# Patient Record
Sex: Male | Born: 1980 | Race: White | Hispanic: No | Marital: Married | Smoking: Never smoker
Health system: Southern US, Community
[De-identification: ages and names within clinical notes are randomized; demographics above are authoritative.]

## PROBLEM LIST (undated history)

## (undated) DIAGNOSIS — L851 Acquired keratosis [keratoderma] palmaris et plantaris: Secondary | ICD-10-CM

## (undated) DIAGNOSIS — E785 Hyperlipidemia, unspecified: Secondary | ICD-10-CM

## (undated) DIAGNOSIS — J13 Pneumonia due to Streptococcus pneumoniae: Secondary | ICD-10-CM

## (undated) HISTORY — DX: Hyperlipidemia, unspecified: E78.5

## (undated) HISTORY — DX: Pneumonia due to Streptococcus pneumoniae: J13

## (undated) HISTORY — DX: Acquired keratosis (keratoderma) palmaris et plantaris: L85.1

---

## 2007-07-10 ENCOUNTER — Ambulatory Visit: Payer: Self-pay | Admitting: Internal Medicine

## 2007-09-03 ENCOUNTER — Ambulatory Visit: Payer: Self-pay | Admitting: Internal Medicine

## 2007-09-13 ENCOUNTER — Ambulatory Visit: Payer: Self-pay | Admitting: Internal Medicine

## 2007-09-14 LAB — CONVERTED CEMR LAB
Direct LDL: 197.6 mg/dL
Total CHOL/HDL Ratio: 8.5

## 2008-06-13 ENCOUNTER — Ambulatory Visit: Payer: Self-pay | Admitting: Internal Medicine

## 2008-06-13 DIAGNOSIS — E785 Hyperlipidemia, unspecified: Secondary | ICD-10-CM

## 2008-06-13 DIAGNOSIS — J13 Pneumonia due to Streptococcus pneumoniae: Secondary | ICD-10-CM

## 2008-06-13 HISTORY — DX: Hyperlipidemia, unspecified: E78.5

## 2008-06-13 HISTORY — DX: Pneumonia due to Streptococcus pneumoniae: J13

## 2008-11-04 ENCOUNTER — Ambulatory Visit: Payer: Self-pay | Admitting: Internal Medicine

## 2008-11-06 LAB — CONVERTED CEMR LAB
AST: 22 units/L (ref 0–37)
Cholesterol: 195 mg/dL (ref 0–200)

## 2009-07-01 ENCOUNTER — Telehealth: Payer: Self-pay | Admitting: Internal Medicine

## 2009-07-15 ENCOUNTER — Ambulatory Visit: Payer: Self-pay | Admitting: Internal Medicine

## 2009-10-06 ENCOUNTER — Ambulatory Visit: Payer: Self-pay | Admitting: Internal Medicine

## 2009-10-06 LAB — CONVERTED CEMR LAB
BUN: 14 mg/dL (ref 6–23)
Basophils Absolute: 0 10*3/uL (ref 0.0–0.1)
Basophils Relative: 0.4 % (ref 0.0–3.0)
Bilirubin, Direct: 0.2 mg/dL (ref 0.0–0.3)
CO2: 30 meq/L (ref 19–32)
Calcium: 9.4 mg/dL (ref 8.4–10.5)
Chloride: 108 meq/L (ref 96–112)
Cholesterol: 206 mg/dL — ABNORMAL HIGH (ref 0–200)
Creatinine, Ser: 0.9 mg/dL (ref 0.4–1.5)
Direct LDL: 161.1 mg/dL
Eosinophils Absolute: 0 10*3/uL (ref 0.0–0.7)
Glucose, Bld: 90 mg/dL (ref 70–99)
MCHC: 35.4 g/dL (ref 30.0–36.0)
MCV: 89.2 fL (ref 78.0–100.0)
Monocytes Absolute: 0.3 10*3/uL (ref 0.1–1.0)
Neutrophils Relative %: 60.5 % (ref 43.0–77.0)
Platelets: 207 10*3/uL (ref 150.0–400.0)
RBC: 4.73 M/uL (ref 4.22–5.81)
RDW: 12.3 % (ref 11.5–14.6)
Total Protein: 7.1 g/dL (ref 6.0–8.3)
Triglycerides: 88 mg/dL (ref 0.0–149.0)

## 2009-10-30 ENCOUNTER — Ambulatory Visit: Payer: Self-pay | Admitting: Internal Medicine

## 2010-02-17 ENCOUNTER — Ambulatory Visit: Payer: Self-pay | Admitting: Internal Medicine

## 2010-02-17 DIAGNOSIS — L851 Acquired keratosis [keratoderma] palmaris et plantaris: Secondary | ICD-10-CM

## 2010-02-17 HISTORY — DX: Acquired keratosis (keratoderma) palmaris et plantaris: L85.1

## 2010-02-17 LAB — CONVERTED CEMR LAB
AST: 20 units/L (ref 0–37)
Cholesterol: 173 mg/dL (ref 0–200)

## 2010-04-27 NOTE — Assessment & Plan Note (Signed)
Summary: dry skin in ears and around eyes/cjrlipids,SGOT//ccm/pt rsc/cjr   Vital Signs:  Patient profile:   30 year old male Weight:      188 pounds Temp:     97.6 degrees F oral BP sitting:   102 / 64  (right arm) Cuff size:   regular  Vitals Entered By: Duard Brady LPN (February 17, 2010 10:04 AM) CC: c/o dry skin behind ears    otc creams/onit not helping Is Patient Diabetic? No   CC:  c/o dry skin behind ears    otc creams/onit not helping.  History of Present Illness: 30 year old patient who has a history of hypercholesterolemia, controlled on Lipitor therapy.  He has a several week history is dry, flaky skin in the postauricular area bilaterally.  He has been using Neosporin for a few weeks and also moisturizing creams.  He does use head and shoulders shampoo daily.  He remains on Lipitor 80 mg daily, which he continues to tolerate well.  Denies any muscle pain or weakness  Allergies (verified): No Known Drug Allergies  Past History:  Past Medical History: Reviewed history from 06/13/2008 and no changes required. no hospital admissions Hyperlipidemia  Review of Systems       The patient complains of suspicious skin lesions.  The patient denies anorexia, fever, weight loss, weight gain, vision loss, decreased hearing, hoarseness, chest pain, syncope, dyspnea on exertion, peripheral edema, prolonged cough, headaches, hemoptysis, abdominal pain, melena, hematochezia, severe indigestion/heartburn, hematuria, incontinence, genital sores, muscle weakness, transient blindness, difficulty walking, depression, unusual weight change, abnormal bleeding, enlarged lymph nodes, angioedema, breast masses, and testicular masses.    Physical Exam  General:  Well-developed,well-nourished,in no acute distress; alert,appropriate and cooperative throughout examination Head:  Normocephalic and atraumatic without obvious abnormalities. No apparent alopecia or balding. Eyes:  No corneal  or conjunctival inflammation noted. EOMI. Perrla. Funduscopic exam benign, without hemorrhages, exudates or papilledema. Vision grossly normal. Ears:  slightly red flaky skin in the posterior to the area; canals and tympanic membranes were normal Mouth:  Oral mucosa and oropharynx without lesions or exudates.  Teeth in good repair.   Impression & Recommendations:  Problem # 1:  HYPERLIPIDEMIA (ICD-272.4)  His updated medication list for this problem includes:    Lipitor 80 Mg Tabs (Atorvastatin calcium) ..... One daily  Orders: Venipuncture (29528) TLB-Cholesterol, Total (82465-CHO) TLB-AST (SGOT) (84450-SGOT)  Problem # 2:  DRY SKIN (ICD-701.1)  Complete Medication List: 1)  Lipitor 80 Mg Tabs (Atorvastatin calcium) .... One daily 2)  Triamcinolone Acetonide 0.1 % Crea (Triamcinolone acetonide) .... Use  twice daily  Patient Instructions: 1)  Please schedule a follow-up appointment as needed. 2)  It is important that you exercise regularly at least 20 minutes 5 times a week. If you develop chest pain, have severe difficulty breathing, or feel very tired , stop exercising immediately and seek medical attention. Prescriptions: TRIAMCINOLONE ACETONIDE 0.1 % CREA (TRIAMCINOLONE ACETONIDE) use  twice daily  #30 gm x 1   Entered and Authorized by:   [name]  MD   Signed by:   [name]  MD on 02/17/2010   Method used:   Electronically to        [facility] (retail)       [address]       [place], [place]  [address]       Ph: [phone]       Fax: [phone]  RxID:   [id number]    Orders Added: 1)  Est. Patient Level III [32440] 2)  Venipuncture [10272] 3)  TLB-Cholesterol, Total [82465-CHO] 4)  TLB-AST (SGOT) [84450-SGOT]  Appended Document: Orders Update    Clinical Lists Changes  Orders: Added new Service order of Specimen Handling (53664) - Signed      Appended Document: Orders  Update    Clinical Lists Changes  Orders: Added new Test order of T- * Misc. Laboratory test [phone]) - Signed

## 2010-04-27 NOTE — Assessment & Plan Note (Signed)
Summary: throat inf/njr   Vital Signs:  Patient profile:   30 year old male Weight:      182 pounds Temp:     98.0 degrees F oral BP sitting:   120 / 80  (right arm) Cuff size:   regular  Vitals Entered By: Duard Brady LPN (October 06, 2009 11:44 AM) CC: c/o coughing up "hard round ballsof phlem" , no pain Is Patient Diabetic? No   CC:  c/o coughing up "hard round ballsof phlem"  and no pain.  History of Present Illness: 30 year old patient who is seen today complaining of a mild sore throat.  His chief complaint is expectorating small concretions which are foul-smelling and causing halitosis.  Denies any fever, sinus pain.  He does admit to mild postnasal drip  Allergies (verified): No Known Drug Allergies  Past History:  Past Medical History: Reviewed history from 06/13/2008 and no changes required. no hospital admissions Hyperlipidemia  Physical Exam  General:  Well-developed,well-nourished,in no acute distress; alert,appropriate and cooperative throughout examination Head:  Normocephalic and atraumatic without obvious abnormalities. No apparent alopecia or balding. Eyes:  No corneal or conjunctival inflammation noted. EOMI. Perrla. Funduscopic exam benign, without hemorrhages, exudates or papilledema. Vision grossly normal. Ears:  External ear exam shows no significant lesions or deformities.  Otoscopic examination reveals clear canals, tympanic membranes are intact bilaterally without bulging, retraction, inflammation or discharge. Hearing is grossly normal bilaterally. Nose:  External nasal examination shows no deformity or inflammation. Nasal mucosa are pink and moist without lesions or exudates. Mouth:  pharyngeal erythema.   Neck:  No deformities, masses, or tenderness noted. Lungs:  Normal respiratory effort, chest expands symmetrically. Lungs are clear to auscultation, no crackles or wheezes.   Impression & Recommendations:  Problem # 1:  PHARYNGITIS-ACUTE  (ICD-462)  Complete Medication List: 1)  Simvastatin 40 Mg Tabs (Simvastatin) .Marland Kitchen.. 1 once daily  Other Orders: Venipuncture (16109) TLB-Lipid Panel (80061-LIPID) TLB-BMP (Basic Metabolic Panel-BMET) (80048-METABOL) TLB-CBC Platelet - w/Differential (85025-CBCD) TLB-Hepatic/Liver Function Pnl (80076-HEPATIC) TLB-TSH (Thyroid Stimulating Hormone) (60454-UJW)  Patient Instructions: 1)  Get plenty of rest, drink lots of clear liquids, and use Tylenol or Ibuprofen for fever and comfort. Return in 7-10 days if you're not better:sooner if you're feeling worse.

## 2010-04-27 NOTE — Assessment & Plan Note (Signed)
Summary: 1 year follow up/patient coming in fasting/cjr rsc bmp/njr   Vital Signs:  Patient profile:   30 year old male Height:      72 inches Weight:      187 pounds BMI:     25.45 Temp:     97.6 degrees F oral BP sitting:   108 / 70  (right arm) Cuff size:   regular  Vitals Entered By: Duard Brady LPN (October 30, 2009 8:09 AM) CC: yrly check up Is Patient Diabetic? No   CC:  yrly check up.  History of Present Illness: a 30 year old patient who has a history of dyslipidemia.  He is on simvastatin 40 mg daily.  He has been compliant with his medications.  He has had no side effects.  Recent laboratory studies were reviewed, and LDL cholesterol was 161.  Allergies (verified): No Known Drug Allergies  Past History:  Past Medical History: Reviewed history from 06/13/2008 and no changes required. no hospital admissions Hyperlipidemia  Past Surgical History: Reviewed history from 07/10/2007 and no changes required. no prior surgery  Review of Systems  The patient denies anorexia, fever, weight loss, weight gain, vision loss, decreased hearing, hoarseness, chest pain, syncope, dyspnea on exertion, peripheral edema, prolonged cough, headaches, hemoptysis, abdominal pain, melena, hematochezia, severe indigestion/heartburn, hematuria, incontinence, genital sores, muscle weakness, suspicious skin lesions, transient blindness, difficulty walking, depression, unusual weight change, abnormal bleeding, enlarged lymph nodes, angioedema, breast masses, and testicular masses.    Physical Exam  General:  mildly overweight.  Blood pressure low normal Head:  Normocephalic and atraumatic without obvious abnormalities. No apparent alopecia or balding. Eyes:  No corneal or conjunctival inflammation noted. EOMI. Perrla. Funduscopic exam benign, without hemorrhages, exudates or papilledema. Vision grossly normal. Mouth:  Oral mucosa and oropharynx without lesions or exudates.  Teeth in  good repair. Neck:  No deformities, masses, or tenderness noted. Lungs:  Normal respiratory effort, chest expands symmetrically. Lungs are clear to auscultation, no crackles or wheezes. Heart:  Normal rate and regular rhythm. S1 and S2 normal without gallop, murmur, click, rub or other extra sounds. Abdomen:  Bowel sounds positive,abdomen soft and non-tender without masses, organomegaly or hernias noted. Genitalia:  Testes bilaterally descended without nodularity, tenderness or masses. No scrotal masses or lesions. No penis lesions or urethral discharge. Msk:  No deformity or scoliosis noted of thoracic or lumbar spine.   Pulses:  R and L carotid,radial,femoral,dorsalis pedis and posterior tibial pulses are full and equal bilaterally Extremities:  No clubbing, cyanosis, edema, or deformity noted with normal full range of motion of all joints.   Neurologic:  alert & oriented X3 and gait normal.  alert & oriented X3 and gait normal.   Cervical Nodes:  No lymphadenopathy noted Psych:  Cognition and judgment appear intact. Alert and cooperative with normal attention span and concentration. No apparent delusions, illusions, hallucinations   Impression & Recommendations:  Problem # 1:  HYPERLIPIDEMIA (ICD-272.4)  The following medications were removed from the medication list:    Simvastatin 40 Mg Tabs (Simvastatin) .Marland Kitchen... 1 once daily His updated medication list for this problem includes:    Lipitor 80 Mg Tabs (Atorvastatin calcium) ..... One daily  The following medications were removed from the medication list:    Simvastatin 40 Mg Tabs (Simvastatin) .Marland Kitchen... 1 once daily His updated medication list for this problem includes:    Lipitor 80 Mg Tabs (Atorvastatin calcium) ..... One daily  Complete Medication List: 1)  Lipitor 80 Mg Tabs (Atorvastatin calcium) .Marland KitchenMarland KitchenMarland Kitchen  One daily  Patient Instructions: 1)  lipid profile, and SGOT in 3 months 2)  Please schedule a follow-up appointment in 1  year. Prescriptions: LIPITOR 80 MG TABS (ATORVASTATIN CALCIUM) one daily  #90 x 6   Entered and Authorized by:   [name]  MD   Signed by:   [name]  MD on 10/30/2009   Method used:   Electronically to        [facility] [address] (retail)       [address]       [place], [place]  [address]       Ph: [phone]       Fax: [phone]   RxID:   [id number]

## 2010-04-27 NOTE — Progress Notes (Signed)
Summary: lab work  Phone Note Call from Patient Call back at [employer] [phone]   Caller: Patient Call For: [name]  MD Summary of Call: pt is on simvastin he had not had bloodwork since 11-04-2008. pt is req labs Initial call taken by: Heron Sabins,  July 01, 2009 4:19 PM  Follow-up for Phone Call        ROV this week with lab Follow-up by: [name]  MD,  July 06, 2009 7:02 AM  Additional Follow-up for Phone Call Additional follow up Details #1::        lmom Additional Follow-up by: Heron Sabins,  July 06, 2009 9:18 AM    Additional Follow-up for Phone Call Additional follow up Details #2::    ov 07-15-2009 915am Follow-up by: Heron Sabins,  July 07, 2009 1:03 PM

## 2010-04-27 NOTE — Assessment & Plan Note (Signed)
Summary: pt will come in fasting/njr   Vital Signs:  Patient profile:   30 year old male Weight:      182 pounds Temp:     97.6 degrees F oral BP sitting:   122 / 70  (right arm) Cuff size:   regular  Vitals Entered By: Duard Brady LPN (July 15, 2009 9:20 AM) CC: medication refill - fasting labs Is Patient Diabetic? No   CC:  medication refill - fasting labs.  History of Present Illness: 30 year old patient who has a history of dyslipidemia, and who has been treated with simvastatin since 2009.  His been on this medication for a couple of weeks.  He is tolerant medication well in the past.  He is seen today basically for a medicine refill.  His past medical issues.  Otherwise, unremarkable.  He denies any muscle pain or weakness.  Preventive Screening-Counseling & Management  Alcohol-Tobacco     Smoking Status: quit  Allergies (verified): No Known Drug Allergies  Past History:  Past Medical History: Reviewed history from 06/13/2008 and no changes required. no hospital admissions Hyperlipidemia  Social History: Smoking Status:  quit  Review of Systems  The patient denies anorexia, fever, weight loss, weight gain, vision loss, decreased hearing, hoarseness, chest pain, syncope, dyspnea on exertion, peripheral edema, prolonged cough, headaches, hemoptysis, abdominal pain, melena, hematochezia, severe indigestion/heartburn, hematuria, incontinence, genital sores, muscle weakness, suspicious skin lesions, transient blindness, difficulty walking, depression, unusual weight change, abnormal bleeding, enlarged lymph nodes, angioedema, breast masses, and testicular masses.    Physical Exam  General:  Well-developed,well-nourished,in no acute distress; alert,appropriate and cooperative throughout examination Head:  Normocephalic and atraumatic without obvious abnormalities. No apparent alopecia or balding. Eyes:  No corneal or conjunctival inflammation noted. EOMI.  Perrla. Funduscopic exam benign, without hemorrhages, exudates or papilledema. Vision grossly normal. Ears:  External ear exam shows no significant lesions or deformities.  Otoscopic examination reveals clear canals, tympanic membranes are intact bilaterally without bulging, retraction, inflammation or discharge. Hearing is grossly normal bilaterally. Mouth:  Oral mucosa and oropharynx without lesions or exudates.  Teeth in good repair. Neck:  No deformities, masses, or tenderness noted. Lungs:  Normal respiratory effort, chest expands symmetrically. Lungs are clear to auscultation, no crackles or wheezes. Heart:  Normal rate and regular rhythm. S1 and S2 normal without gallop, murmur, click, rub or other extra sounds. Abdomen:  Bowel sounds positive,abdomen soft and non-tender without masses, organomegaly or hernias noted. Msk:  No deformity or scoliosis noted of thoracic or lumbar spine.   Pulses:  R and L carotid,radial,femoral,dorsalis pedis and posterior tibial pulses are full and equal bilaterally Extremities:  No clubbing, cyanosis, edema, or deformity noted with normal full range of motion of all joints.     Impression & Recommendations:  Problem # 1:  HYPERLIPIDEMIA (ICD-272.4)  His updated medication list for this problem includes:    Simvastatin 40 Mg Tabs (Simvastatin) .Marland Kitchen... 1 once daily  His updated medication list for this problem includes:    Simvastatin 40 Mg Tabs (Simvastatin) .Marland Kitchen... 1 once daily  Complete Medication List: 1)  Simvastatin 40 Mg Tabs (Simvastatin) .Marland Kitchen.. 1 once daily  Patient Instructions: 1)  Please schedule a follow-up appointment in 1 year. 2)  It is important that you exercise regularly at least 20 minutes 5 times a week. If you develop chest pain, have severe difficulty breathing, or feel very tired , stop exercising immediately and seek medical attention. Prescriptions: SIMVASTATIN 40 MG  TABS (SIMVASTATIN) 1 once  daily  #90 x 6   Entered and  Authorized by:   [name]  MD   Signed by:   [name]  MD on 07/15/2009   Method used:   Print then Give to Patient   RxID:   [id number] SIMVASTATIN 40 MG  TABS (SIMVASTATIN) 1 once daily  #90 x 6   Entered and Authorized by:   [name]  MD   Signed by:   [name]  MD on 07/15/2009   Method used:   Electronically to        [facility] # [id number] (retail)       [address]       [place], [place]  [address]       Ph: [phone]       Fax: [phone]   RxID:   [id number]

## 2010-08-10 ENCOUNTER — Telehealth: Payer: Self-pay | Admitting: *Deleted

## 2010-08-10 NOTE — Telephone Encounter (Signed)
Pt believes he was attacked by a bat last night, and he has broken skin areas in his head.  What should he do?

## 2010-08-10 NOTE — Telephone Encounter (Signed)
Per Dr. Kirtland Bouchard, have pt call the Public Health Dept.   Pt was informed.

## 2010-08-11 ENCOUNTER — Ambulatory Visit (INDEPENDENT_AMBULATORY_CARE_PROVIDER_SITE_OTHER): Payer: BC Managed Care – PPO | Admitting: Family Medicine

## 2010-08-11 ENCOUNTER — Emergency Department (HOSPITAL_COMMUNITY)
Admission: EM | Admit: 2010-08-11 | Discharge: 2010-08-11 | Disposition: A | Discharge status: Home / Self Care | Payer: BC Managed Care – PPO | Attending: Emergency Medicine | Admitting: Emergency Medicine

## 2010-08-11 ENCOUNTER — Encounter: Payer: Self-pay | Admitting: Family Medicine

## 2010-08-11 VITALS — BP 130/84 | Temp 97.9°F | Ht 71.25 in | Wt 186.0 lb

## 2010-08-11 DIAGNOSIS — IMO0002 Reserved for concepts with insufficient information to code with codable children: Secondary | ICD-10-CM | POA: Insufficient documentation

## 2010-08-11 DIAGNOSIS — Z203 Contact with and (suspected) exposure to rabies: Secondary | ICD-10-CM | POA: Insufficient documentation

## 2010-08-11 DIAGNOSIS — W64XXXA Exposure to other animate mechanical forces, initial encounter: Secondary | ICD-10-CM | POA: Insufficient documentation

## 2010-08-11 DIAGNOSIS — T1490XA Injury, unspecified, initial encounter: Secondary | ICD-10-CM

## 2010-08-11 DIAGNOSIS — T148XXA Other injury of unspecified body region, initial encounter: Secondary | ICD-10-CM

## 2010-08-11 NOTE — Progress Notes (Signed)
  Subjective:    Patient ID: Hayden Murphy, male    DOB: [birth year], 30 y.o.   MRN: [id number]  HPI Patient is seen with possible exposure to bat. He was walking outside Saturday night which was 4 days ago now and in the middle of the road when he felt a scratching sensation mid parietal occipital area. Did not actually see a bat but assumed bat might have bitten him or scratched him. He is not aware of any other likely mechanism of injury. Was not walking underneath a tree use or branches. He has not had any fever, chills, headaches or other symptoms.  Patient apparently called the department of public health and was told to call here.   Review of Systems  Constitutional: Negative for fever and chills.  HENT: Negative for neck pain and neck stiffness.   Musculoskeletal: Negative for arthralgias.  Neurological: Negative for dizziness, seizures, syncope, weakness and headaches.  Hematological: Negative for adenopathy.  Psychiatric/Behavioral: Negative for confusion.       Objective:   Physical Exam  Constitutional: He is oriented to person, place, and time. He appears well-developed and well-nourished. No distress.  HENT:  Head: Normocephalic.       Patient has 3 separate small punctate eschars mid parietal occipital region. No evidence for cellulitis  Cardiovascular: Normal rate, regular rhythm and normal heart sounds.   No murmur heard. Pulmonary/Chest: Effort normal and breath sounds normal. No respiratory distress. He has no wheezes. He has no rales.  Neurological: He is alert and oriented to person, place, and time. No cranial nerve deficit.          Assessment & Plan:  Patient presents with possible bite from a bat. There is no other valid explanation for his injury. He was in wide open space and had seen bats flying in the area though he cannot confirm that he was bitten by a bat. There was definite break in the skin. Given the potentially dire consequences of any potential  rabies exposure patient needs rabies immunoglobulin and vaccine. Discussed with Dr. Roxan Hockey [facility] health Department who concurs. Patient sent to [facility] emergency room for vaccine and immunoglobulin.

## 2010-08-11 NOTE — Patient Instructions (Signed)
Go to the ER at [facility] for rabies immunoglobulin and vaccine.

## 2010-08-14 ENCOUNTER — Inpatient Hospital Stay (INDEPENDENT_AMBULATORY_CARE_PROVIDER_SITE_OTHER)
Admission: RE | Admit: 2010-08-14 | Discharge: 2010-08-14 | Disposition: A | Discharge status: Home / Self Care | Payer: BC Managed Care – PPO | Source: Ambulatory Visit | Attending: Family Medicine | Admitting: Family Medicine

## 2010-08-14 DIAGNOSIS — Z23 Encounter for immunization: Secondary | ICD-10-CM

## 2010-08-19 ENCOUNTER — Inpatient Hospital Stay (INDEPENDENT_AMBULATORY_CARE_PROVIDER_SITE_OTHER)
Admission: RE | Admit: 2010-08-19 | Discharge: 2010-08-19 | Disposition: A | Discharge status: Home / Self Care | Payer: BC Managed Care – PPO | Source: Ambulatory Visit | Attending: Emergency Medicine | Admitting: Emergency Medicine

## 2010-08-19 DIAGNOSIS — Z23 Encounter for immunization: Secondary | ICD-10-CM

## 2010-08-26 ENCOUNTER — Inpatient Hospital Stay (INDEPENDENT_AMBULATORY_CARE_PROVIDER_SITE_OTHER)
Admission: RE | Admit: 2010-08-26 | Discharge: 2010-08-26 | Disposition: A | Discharge status: Home / Self Care | Payer: BC Managed Care – PPO | Source: Ambulatory Visit

## 2010-08-26 DIAGNOSIS — Z23 Encounter for immunization: Secondary | ICD-10-CM

## 2010-11-19 ENCOUNTER — Other Ambulatory Visit: Payer: Self-pay | Admitting: Internal Medicine

## 2010-12-31 ENCOUNTER — Ambulatory Visit (INDEPENDENT_AMBULATORY_CARE_PROVIDER_SITE_OTHER): Payer: BC Managed Care – PPO | Admitting: Internal Medicine

## 2010-12-31 ENCOUNTER — Encounter: Payer: Self-pay | Admitting: Internal Medicine

## 2010-12-31 ENCOUNTER — Telehealth: Payer: Self-pay | Admitting: Internal Medicine

## 2010-12-31 VITALS — BP 130/74 | Temp 98.0°F | Wt 190.0 lb

## 2010-12-31 DIAGNOSIS — H6090 Unspecified otitis externa, unspecified ear: Secondary | ICD-10-CM

## 2010-12-31 DIAGNOSIS — H60399 Other infective otitis externa, unspecified ear: Secondary | ICD-10-CM

## 2010-12-31 MED ORDER — NEOMYCIN-POLYMYXIN-HC 3.5-10000-1 OT SOLN: 3.0000 [drp] | Freq: Three times a day (TID) | OTIC | Status: AC

## 2010-12-31 MED ORDER — TRIAMCINOLONE ACETONIDE 0.025 % EX CREA: TOPICAL_CREAM | Freq: Two times a day (BID) | CUTANEOUS | Status: DC

## 2010-12-31 MED ORDER — DIPHENHYD-HC-NYSTATIN-TETRACYC MT SUSP: OROMUCOSAL | Status: DC

## 2010-12-31 NOTE — Patient Instructions (Signed)
Call or return to clinic prn if these symptoms worsen or fail to improve as anticipated.

## 2010-12-31 NOTE — Progress Notes (Signed)
  Subjective:    Patient ID: Hayden Murphy, male    DOB: [birth year], 30 y.o.   MRN: [id number]  HPI  30 year old patient who is seen today for followup. He has 2 concerns for the past few days she has had some drainage from the right ear is noted some minimal discomfort. For the past 2 weeks he has had some mild discomfort and burning quality to the tip of his tongue. Denies any fever. He takes no chronic medications other than Lipitor    Review of Systems  Constitutional: Negative for fever, chills, appetite change and fatigue.  HENT: Positive for ear discharge. Negative for hearing loss, ear pain, congestion, sore throat, trouble swallowing, neck stiffness, dental problem, voice change and tinnitus.   Eyes: Negative for pain, discharge and visual disturbance.  Respiratory: Negative for cough, chest tightness, wheezing and stridor.   Cardiovascular: Negative for chest pain, palpitations and leg swelling.  Gastrointestinal: Negative for nausea, vomiting, abdominal pain, diarrhea, constipation, blood in stool and abdominal distention.  Genitourinary: Negative for urgency, hematuria, flank pain, discharge, difficulty urinating and genital sores.  Musculoskeletal: Negative for myalgias, back pain, joint swelling, arthralgias and gait problem.  Skin: Negative for rash.  Neurological: Negative for dizziness, syncope, speech difficulty, weakness, numbness and headaches.  Hematological: Negative for adenopathy. Does not bruise/bleed easily.  Psychiatric/Behavioral: Negative for behavioral problems and dysphoric mood. The patient is not nervous/anxious.        Objective:   Physical Exam  Constitutional: He appears well-developed and well-nourished.  HENT:  Left Ear: External ear normal.  Nose: Nose normal.  Mouth/Throat: No oropharyngeal exudate.       Right canal was minimally erythematous with some dry exudate in the canal. The right tympanic membrane was slightly erythematous. No. A  periaericular adenopathy  The tip of the tongue was slightly erythematous and atrophic with lack of normal papilla  Neck: Normal range of motion. Neck supple.  Lymphadenopathy:    He has no cervical adenopathy.          Assessment & Plan:

## 2010-12-31 NOTE — Telephone Encounter (Signed)
Spoke with pharmacy - gave new order

## 2010-12-31 NOTE — Telephone Encounter (Signed)
Please advise 

## 2010-12-31 NOTE — Telephone Encounter (Signed)
Change to generic [facility]  magic mouthwash 6 ounces 1 teaspoon swish and swallow 4 times daily

## 2010-12-31 NOTE — Telephone Encounter (Signed)
Pharmacist needs the qty of compound for Diphenhyd-HC-Nystatin-Tetracyc SUSP and the formula for this.

## 2011-02-25 ENCOUNTER — Ambulatory Visit (INDEPENDENT_AMBULATORY_CARE_PROVIDER_SITE_OTHER): Payer: BC Managed Care – PPO | Admitting: Internal Medicine

## 2011-02-25 ENCOUNTER — Encounter: Payer: Self-pay | Admitting: Internal Medicine

## 2011-02-25 VITALS — BP 110/70 | Temp 98.2°F | Wt 188.0 lb

## 2011-02-25 DIAGNOSIS — J069 Acute upper respiratory infection, unspecified: Secondary | ICD-10-CM

## 2011-02-25 MED ORDER — HYDROCODONE-HOMATROPINE 5-1.5 MG/5ML PO SYRP: 5.0000 mL | ORAL_SOLUTION | Freq: Four times a day (QID) | ORAL | Status: AC | PRN

## 2011-02-25 NOTE — Patient Instructions (Signed)
Get plenty of rest, Drink lots of  clear liquids, and use Tylenol or ibuprofen for fever and discomfort.    Call or return to clinic prn if these symptoms worsen or fail to improve as anticipated.  

## 2011-02-25 NOTE — Progress Notes (Signed)
  Subjective:    Patient ID: Natanael Saladin, male    DOB: 12-14-1980, 30 y.o.   MRN: [id number]  HPI  30 year old patient who presents with a three-week history of head and chest congestion. He has had no recent fever he has sinus congestion and minimal cough productive of yellow sputum he has mild sore throat and hoarseness. He does have a history of community acquired pneumonia 2 years ago.    Review of Systems  Constitutional: Positive for fatigue. Negative for fever and chills.  HENT: Positive for congestion, sore throat, rhinorrhea and voice change.   Respiratory: Positive for cough.        Objective:   Physical Exam  Constitutional: He appears well-developed and well-nourished.  HENT:  Head: Normocephalic and atraumatic.  Mouth/Throat: No oropharyngeal exudate.       Mild erythema of the oropharynx  Neck: Normal range of motion. Neck supple. No thyromegaly present.  Cardiovascular: Regular rhythm, normal heart sounds and intact distal pulses.   Pulmonary/Chest: Effort normal and breath sounds normal. No respiratory distress. He has no wheezes.       O2 saturation 98%  Lymphadenopathy:    He has no cervical adenopathy.          Assessment & Plan:   Viral URI. Will continue symptomatic treatment

## 2011-08-26 ENCOUNTER — Other Ambulatory Visit: Payer: Self-pay | Admitting: Internal Medicine

## 2011-09-27 ENCOUNTER — Ambulatory Visit (INDEPENDENT_AMBULATORY_CARE_PROVIDER_SITE_OTHER): Payer: BC Managed Care – PPO | Admitting: Internal Medicine

## 2011-09-27 ENCOUNTER — Encounter: Payer: Self-pay | Admitting: Internal Medicine

## 2011-09-27 ENCOUNTER — Telehealth: Payer: Self-pay | Admitting: Internal Medicine

## 2011-09-27 VITALS — BP 110/70 | Temp 98.4°F | Wt 189.0 lb

## 2011-09-27 DIAGNOSIS — H6090 Unspecified otitis externa, unspecified ear: Secondary | ICD-10-CM

## 2011-09-27 DIAGNOSIS — H60399 Other infective otitis externa, unspecified ear: Secondary | ICD-10-CM

## 2011-09-27 DIAGNOSIS — L851 Acquired keratosis [keratoderma] palmaris et plantaris: Secondary | ICD-10-CM

## 2011-09-27 MED ORDER — DIPHENHYD-HC-NYSTATIN-TETRACYC MT SUSP: OROMUCOSAL | Status: DC

## 2011-09-27 MED ORDER — TRIAMCINOLONE ACETONIDE 0.1 % EX CREA: TOPICAL_CREAM | Freq: Two times a day (BID) | CUTANEOUS | Status: DC

## 2011-09-27 MED ORDER — SALICYLIC ACID 2 % EX SHAM: MEDICATED_SHAMPOO | Freq: Every day | CUTANEOUS | Status: AC | PRN

## 2011-09-27 MED ORDER — ATORVASTATIN CALCIUM 80 MG PO TABS: 80.0000 mg | ORAL_TABLET | Freq: Every day | ORAL | Status: DC

## 2011-09-27 NOTE — Telephone Encounter (Signed)
Magic mouthwash 6 oz one tsp swish and swallow QID

## 2011-09-27 NOTE — Progress Notes (Signed)
  Subjective:    Patient ID: Hayden Murphy, male    DOB: Apr 29, 1980, 31 y.o.   MRN: [id number]  HPI 31 year old patient who is seen today for dermatologic concerns. He was seen approximately 7 months ago and treated for a right otitis externa. He continues to have intermittent drainage from the right ear. He also has noted a dry scaly patch involving his left frontal scalp area.   Review of Systems  HENT: Positive for ear discharge.   Skin: Positive for rash.       Objective:   Physical Exam  Constitutional: He appears well-developed and well-nourished. No distress.  HENT:       Left tympanic membrane and canal normal The right tympanic membrane was normal. There is some mild erythematous changes in the canal but nothing very striking No adenopathy  Skin:       A 2-1/2 cm patchy area of dried thickened skin noted in the left frontal scalp area          Assessment & Plan:   Otitis externa Scalp dermatitis  Will refill otic drops that were quite helpful in the past; will try a coal  tar shampoo and consider dermatologic referral if unimproved

## 2011-09-27 NOTE — Patient Instructions (Signed)
Call or return to clinic prn if these symptoms worsen or fail to improve as anticipated.

## 2011-09-27 NOTE — Telephone Encounter (Signed)
[facility] [employer] and states has received script for Diphenhydramine Mouthwash that contains Tetracycline. Has not been available for "15 years" and would like a new script sent to [phone]-phone number as pt. Is at this pharmacy to pick up med. States Magic Mouthwash is what is usually used in place of prescribed mouthwash.

## 2011-09-28 ENCOUNTER — Telehealth: Payer: Self-pay | Admitting: Internal Medicine

## 2011-09-28 MED ORDER — NEOMYCIN-POLYMYXIN-HC 3.5-10000-1 OT SOLN: 2.0000 [drp] | Freq: Four times a day (QID) | OTIC | Status: DC

## 2011-09-28 MED ORDER — MAGIC MOUTHWASH W/LIDOCAINE: 5.0000 mL | Freq: Four times a day (QID) | ORAL | Status: DC

## 2011-09-28 NOTE — Telephone Encounter (Signed)
done

## 2011-09-28 NOTE — Telephone Encounter (Signed)
rx sen tp pt aware

## 2011-09-28 NOTE — Telephone Encounter (Signed)
Generic cortisporin otic  10 cc 2 gtts  QID

## 2011-09-28 NOTE — Telephone Encounter (Signed)
Patient called stating that he did not have an rx called in for his ear. Should this be OTC?

## 2011-10-21 ENCOUNTER — Encounter: Payer: Self-pay | Admitting: Family Medicine

## 2011-10-21 ENCOUNTER — Ambulatory Visit (INDEPENDENT_AMBULATORY_CARE_PROVIDER_SITE_OTHER)
Admission: RE | Admit: 2011-10-21 | Discharge: 2011-10-21 | Disposition: A | Discharge status: Home / Self Care | Payer: BC Managed Care – PPO | Source: Ambulatory Visit | Attending: Family Medicine | Admitting: Family Medicine

## 2011-10-21 ENCOUNTER — Ambulatory Visit (INDEPENDENT_AMBULATORY_CARE_PROVIDER_SITE_OTHER): Payer: BC Managed Care – PPO | Admitting: Family Medicine

## 2011-10-21 VITALS — BP 102/68 | HR 78 | Temp 98.3°F | Wt 189.0 lb

## 2011-10-21 DIAGNOSIS — S93409A Sprain of unspecified ligament of unspecified ankle, initial encounter: Secondary | ICD-10-CM

## 2011-10-21 NOTE — Progress Notes (Signed)
  Subjective:    Patient ID: Hayden Murphy, male    DOB: [birth year], 31 y.o.   MRN: [id number]  HPI Here for an injury to the right ankle yesterday while running. He stepped on a curb and twisted the ankle. He has had pain and swelling since, more so on the medial side. Using ice and Advil.    Review of Systems  Constitutional: Negative.   Musculoskeletal: Positive for joint swelling and arthralgias.       Objective:   Physical Exam  Constitutional:       In pain, limping  Musculoskeletal:       The right ankle is mildly swollen, not ecchymotic. Quite tender just inferior to both medial and lateral malleoli. Not tender over the tibia or fibula. Good ROM.           Assessment & Plan:  Probable high ankle sprain. Fitted with an Aircast splint. He will go from here to [facility] to get Xrays. Use Advil and ice.

## 2011-10-21 NOTE — Progress Notes (Signed)
Quick Note:  I spoke with pt ______ 

## 2012-03-13 ENCOUNTER — Other Ambulatory Visit: Payer: Self-pay | Admitting: Internal Medicine

## 2012-07-10 ENCOUNTER — Other Ambulatory Visit: Payer: Self-pay | Admitting: Internal Medicine

## 2012-09-07 ENCOUNTER — Ambulatory Visit (INDEPENDENT_AMBULATORY_CARE_PROVIDER_SITE_OTHER): Payer: BC Managed Care – PPO | Admitting: Internal Medicine

## 2012-09-07 ENCOUNTER — Encounter: Payer: Self-pay | Admitting: Internal Medicine

## 2012-09-07 VITALS — BP 110/70 | HR 76 | Temp 97.8°F | Resp 18 | Wt 193.0 lb

## 2012-09-07 DIAGNOSIS — E785 Hyperlipidemia, unspecified: Secondary | ICD-10-CM

## 2012-09-07 MED ORDER — ATORVASTATIN CALCIUM 80 MG PO TABS: ORAL_TABLET | ORAL | Status: DC

## 2012-09-07 NOTE — Patient Instructions (Signed)
It is important that you exercise regularly, at least 20 minutes 3 to 4 times per week.  If you develop chest pain or shortness of breath seek  medical attention.  Call or return to clinic as needed   

## 2012-09-07 NOTE — Progress Notes (Signed)
Subjective:    Patient ID: Hayden Murphy, male    DOB: 1980-12-25, 32 y.o.   MRN: [id number]  HPI 32 year old patient who has a history of dyslipidemia. Remains on atorvastatin 80 mg. Done quite well without concerns or complaints today. Nonsmoker.  Past Medical History  Diagnosis Date  . HYPERLIPIDEMIA 06/13/2008  . PNEUMONIA, COMMUNITY ACQUIRED, PNEUMOCOCCAL 06/13/2008  . DRY SKIN 02/17/2010    History   Social History  . Marital Status: Married    Spouse Name: N/A    Number of Children: N/A  . Years of Education: N/A   Occupational History  . Not on file.   Social History Main Topics  . Smoking status: Never Smoker   . Smokeless tobacco: Never Used  . Alcohol Use: 1.0 oz/week    2 drink(s) per week  . Drug Use: No  . Sexually Active: Not on file   Other Topics Concern  . Not on file   Social History Narrative  . No narrative on file    History reviewed. No pertinent past surgical history.  Family History  Problem Relation Age of Onset  . Hyperlipidemia Father   . Hyperlipidemia Brother     both brothers    No Known Allergies  Current Outpatient Prescriptions on File Prior to Visit  Medication Sig Dispense Refill  . atorvastatin (LIPITOR) 80 MG tablet TAKE ONE TABLET BY MOUTH ONE TIME DAILY  90 tablet  0  . coal tar-salicylic acid 2 % shampoo Apply topically daily as needed for itching.  120 mL  12   No current facility-administered medications on file prior to visit.    BP 110/70  Pulse 76  Temp(Src) 97.8 F (36.6 C) (Oral)  Resp 18  Wt 193 lb (87.544 kg)  BMI 26.72 kg/m2  SpO2 98%       Review of Systems  Constitutional: Negative for fever, chills, appetite change and fatigue.  HENT: Negative for hearing loss, ear pain, congestion, sore throat, trouble swallowing, neck stiffness, dental problem, voice change and tinnitus.   Eyes: Negative for pain, discharge and visual disturbance.  Respiratory: Negative for cough, chest tightness,  wheezing and stridor.   Cardiovascular: Negative for chest pain, palpitations and leg swelling.  Gastrointestinal: Negative for nausea, vomiting, abdominal pain, diarrhea, constipation, blood in stool and abdominal distention.  Genitourinary: Negative for urgency, hematuria, flank pain, discharge, difficulty urinating and genital sores.  Musculoskeletal: Negative for myalgias, back pain, joint swelling, arthralgias and gait problem.  Skin: Negative for rash.  Neurological: Negative for dizziness, syncope, speech difficulty, weakness, numbness and headaches.  Hematological: Negative for adenopathy. Does not bruise/bleed easily.  Psychiatric/Behavioral: Negative for behavioral problems and dysphoric mood. The patient is not nervous/anxious.        Objective:   Physical Exam  Constitutional: He is oriented to person, place, and time. He appears well-developed.  HENT:  Head: Normocephalic.  Right Ear: External ear normal.  Left Ear: External ear normal.  Eyes: Conjunctivae and EOM are normal.  Neck: Normal range of motion.  Cardiovascular: Normal rate and normal heart sounds.   Pulmonary/Chest: Breath sounds normal.  Abdominal: Bowel sounds are normal.  Musculoskeletal: Normal range of motion. He exhibits no edema and no tenderness.  Neurological: He is alert and oriented to person, place, and time.  Psychiatric: He has a normal mood and affect. His behavior is normal.          Assessment & Plan:   Dyslipidemia. We'll continue atorvastatin 80. Recheck 1 year or  as needed

## 2013-06-25 ENCOUNTER — Encounter: Payer: Self-pay | Admitting: Family Medicine

## 2013-06-25 ENCOUNTER — Ambulatory Visit (INDEPENDENT_AMBULATORY_CARE_PROVIDER_SITE_OTHER): Payer: BC Managed Care – PPO | Admitting: Family Medicine

## 2013-06-25 VITALS — BP 128/72 | HR 81 | Wt 194.0 lb

## 2013-06-25 DIAGNOSIS — M545 Low back pain, unspecified: Secondary | ICD-10-CM

## 2013-06-25 MED ORDER — PREDNISONE 10 MG PO TABS: ORAL_TABLET | ORAL | Status: DC

## 2013-06-25 MED ORDER — METHOCARBAMOL 500 MG PO TABS: 500.0000 mg | ORAL_TABLET | Freq: Three times a day (TID) | ORAL | Status: DC | PRN

## 2013-06-25 NOTE — Progress Notes (Signed)
   Subjective:    Patient ID: Hayden Murphy, male    DOB: 07/26/1980, 33 y.o.   MRN: [id number]  Back Pain Pertinent negatives include no abdominal pain, chest pain, dysuria, fever, numbness or weakness.   Acute visit for lumbar back pain. Onset 5-6 days ago. No specific injury. He noticed this after some prolonged sitting. Location is left lower lumbar and somewhat generalized. No radiculopathy symptoms. No numbness or weakness. No appetite changes, fever, chills, or any dysuria.  Pain is worse with sitting and somewhat relieved with lying and walking. Pain also worse with back flexion. He's taken Aleve and Advil without much improvement. No prior history of back difficulties  Past Medical History  Diagnosis Date  . HYPERLIPIDEMIA 06/13/2008  . PNEUMONIA, COMMUNITY ACQUIRED, PNEUMOCOCCAL 06/13/2008  . DRY SKIN 02/17/2010   No past surgical history on file.  reports that he has never smoked. He has never used smokeless tobacco. He reports that he drinks about 1.0 ounces of alcohol per week. He reports that he does not use illicit drugs. family history includes Hyperlipidemia in his brother and father. No Known Allergies    Review of Systems  Constitutional: Negative for fever, chills, activity change, appetite change and unexpected weight change.  Respiratory: Negative for cough and shortness of breath.   Cardiovascular: Negative for chest pain and leg swelling.  Gastrointestinal: Negative for vomiting and abdominal pain.  Genitourinary: Negative for dysuria, hematuria and flank pain.  Musculoskeletal: Positive for back pain. Negative for joint swelling.  Neurological: Negative for weakness and numbness.       Objective:   Physical Exam  Constitutional: He appears well-developed and well-nourished.  Cardiovascular: Normal rate and regular rhythm.   Pulmonary/Chest: Effort normal and breath sounds normal. No respiratory distress. He has no wheezes. He has no rales.    Musculoskeletal: He exhibits no edema.  Straight leg raise is negative on the left. He has pain with back flexion of about 60. No pain with back extension.  Neurological:  Full-strength lower extremities. Symmetric reflexes.          Assessment & Plan:  Acute lumbar back pain. Nonfocal exam neurologically. Robaxin 500 mg every 8 hours as needed for muscle spasm. Caution about sedation. Prednisone taper over 8 days. Consider physical therapy if not improving over the next couple of weeks

## 2013-06-25 NOTE — Progress Notes (Signed)
Pre visit review using our clinic review tool, if applicable. No additional management support is needed unless otherwise documented below in the visit note. 

## 2013-06-25 NOTE — Patient Instructions (Signed)
Lumbosacral Strain Lumbosacral strain is a strain of any of the parts that make up your lumbosacral vertebrae. Your lumbosacral vertebrae are the bones that make up the lower third of your backbone. Your lumbosacral vertebrae are held together by muscles and tough, fibrous tissue (ligaments).  CAUSES  A sudden blow to your back can cause lumbosacral strain. Also, anything that causes an excessive stretch of the muscles in the low back can cause this strain. This is typically seen when people exert themselves strenuously, fall, lift heavy objects, bend, or crouch repeatedly. RISK FACTORS  Physically demanding work.  Participation in pushing or pulling sports or sports that require sudden twist of the back (tennis, golf, baseball).  Weight lifting.  Excessive lower back curvature.  Forward-tilted pelvis.  Weak back or abdominal muscles or both.  Tight hamstrings. SIGNS AND SYMPTOMS  Lumbosacral strain may cause pain in the area of your injury or pain that moves (radiates) down your leg.  DIAGNOSIS Your health care provider can often diagnose lumbosacral strain through a physical exam. In some cases, you may need tests such as X-ray exams.  TREATMENT  Treatment for your lower back injury depends on many factors that your clinician will have to evaluate. However, most treatment will include the use of anti-inflammatory medicines. HOME CARE INSTRUCTIONS   Avoid hard physical activities (tennis, racquetball, waterskiing) if you are not in proper physical condition for it. This may aggravate or create problems.  If you have a back problem, avoid sports requiring sudden body movements. Swimming and walking are generally safer activities.  Maintain good posture.  Maintain a healthy weight.  For acute conditions, you may put ice on the injured area.  Put ice in a plastic bag.  Place a towel between your skin and the bag.  Leave the ice on for 20 minutes, 2 3 times a day.  When the  low back starts healing, stretching and strengthening exercises may be recommended. SEEK MEDICAL CARE IF:  Your back pain is getting worse.  You experience severe back pain not relieved with medicines. SEEK IMMEDIATE MEDICAL CARE IF:   You have numbness, tingling, weakness, or problems with the use of your arms or legs.  There is a change in bowel or bladder control.  You have increasing pain in any area of the body, including your belly (abdomen).  You notice shortness of breath, dizziness, or feel faint.  You feel sick to your stomach (nauseous), are throwing up (vomiting), or become sweaty.  You notice discoloration of your toes or legs, or your feet get very cold. MAKE SURE YOU:   Understand these instructions.  Will watch your condition.  Will get help right away if you are not doing well or get worse. Document Released: 12/22/2004 Document Revised: 01/02/2013 Document Reviewed: 10/31/2012 Exit[employer] Patient Information 2014 [employer].  

## 2013-07-02 ENCOUNTER — Emergency Department (HOSPITAL_COMMUNITY): Payer: BC Managed Care – PPO

## 2013-07-02 ENCOUNTER — Encounter (HOSPITAL_COMMUNITY): Payer: Self-pay | Admitting: Emergency Medicine

## 2013-07-02 ENCOUNTER — Emergency Department (HOSPITAL_COMMUNITY)
Admission: EM | Admit: 2013-07-02 | Discharge: 2013-07-02 | Disposition: A | Discharge status: Home / Self Care | Payer: BC Managed Care – PPO | Attending: Emergency Medicine | Admitting: Emergency Medicine

## 2013-07-02 DIAGNOSIS — Z8701 Personal history of pneumonia (recurrent): Secondary | ICD-10-CM | POA: Insufficient documentation

## 2013-07-02 DIAGNOSIS — E785 Hyperlipidemia, unspecified: Secondary | ICD-10-CM | POA: Insufficient documentation

## 2013-07-02 DIAGNOSIS — M545 Low back pain, unspecified: Secondary | ICD-10-CM | POA: Insufficient documentation

## 2013-07-02 DIAGNOSIS — Z872 Personal history of diseases of the skin and subcutaneous tissue: Secondary | ICD-10-CM | POA: Insufficient documentation

## 2013-07-02 DIAGNOSIS — Z79899 Other long term (current) drug therapy: Secondary | ICD-10-CM | POA: Insufficient documentation

## 2013-07-02 MED ORDER — IBUPROFEN 800 MG PO TABS: 800.0000 mg | ORAL_TABLET | Freq: Three times a day (TID) | ORAL | Status: AC

## 2013-07-02 MED ORDER — OXYCODONE-ACETAMINOPHEN 5-325 MG PO TABS: 2.0000 | ORAL_TABLET | ORAL | Status: AC | PRN

## 2013-07-02 MED ORDER — HYDROMORPHONE HCL PF 1 MG/ML IJ SOLN
1.0000 mg | Freq: Once | INTRAMUSCULAR | Status: AC
  Administered 2013-07-02: 1 mg via INTRAMUSCULAR
  Filled 2013-07-02: qty 1

## 2013-07-02 MED ORDER — IBUPROFEN 800 MG PO TABS
800.0000 mg | ORAL_TABLET | Freq: Once | ORAL | Status: AC
  Administered 2013-07-02: 800 mg via ORAL
  Filled 2013-07-02: qty 1

## 2013-07-02 NOTE — ED Notes (Signed)
Pt states he was taking muscle relaxers and antiinflamitories given to him by PCP for past 1 1/2 weeks but he bent down today and heard two pops in his hips and now has pain in lower back that wraps around to his lower groin. Alert and oriented.

## 2013-07-02 NOTE — ED Provider Notes (Signed)
CSN: [id number]     Arrival date & time 07/02/13  0202 History   First MD Initiated Contact with Patient 07/02/13 [phone]     Chief Complaint  Patient presents with  . Back Pain     (Consider location/radiation/quality/duration/timing/severity/associated sxs/prior Treatment) HPI History provided by patient. Low back pain ongoing for the last 2 weeks. Saw his primary care physician and was prescribed muscle relaxer and steroids. He has been steroids and still taking muscle relaxers as needed. Tonight he was walking with his son and bend over or feeling a pop in his back. He was able walk home and now pain has become more severe, radiating from left lower back to left hip. It also across the right side of his back. No radiation to lower extremity. No lower extremity weakness or numbness. No incontinence of bowel or bladder. He has never had imaging of his back. Pain is sharp in quality and moderate to severe, worse with any type of movement.  Past Medical History  Diagnosis Date  . HYPERLIPIDEMIA 06/13/2008  . PNEUMONIA, COMMUNITY ACQUIRED, PNEUMOCOCCAL 06/13/2008  . DRY SKIN 02/17/2010   History reviewed. No pertinent past surgical history. Family History  Problem Relation Age of Onset  . Hyperlipidemia Father   . Hyperlipidemia Brother     both brothers   History  Substance Use Topics  . Smoking status: Never Smoker   . Smokeless tobacco: Never Used  . Alcohol Use: 1.0 oz/week    2 drink(s) per week    Review of Systems  Constitutional: Negative for fever and chills.  Respiratory: Negative for shortness of breath.   Cardiovascular: Negative for chest pain.  Gastrointestinal: Negative for abdominal pain.  Genitourinary: Negative for dysuria and difficulty urinating.  Musculoskeletal: Positive for back pain.  Skin: Negative for rash.  Neurological: Negative for weakness, numbness and headaches.  All other systems reviewed and are negative.      Allergies  Review of patient's  allergies indicates no known allergies.  Home Medications   Current Outpatient Rx  Name  Route  Sig  Dispense  Refill  . atorvastatin (LIPITOR) 80 MG tablet      TAKE ONE TABLET BY MOUTH ONE TIME DAILY   90 tablet   6   . methocarbamol (ROBAXIN) 500 MG tablet   Oral   Take 1 tablet (500 mg total) by mouth 3 (three) times daily as needed for muscle spasms.   20 tablet   0   . predniSONE (DELTASONE) 10 MG tablet   Oral   Take 10 mg by mouth daily with breakfast. 7 day therapy course patient completed on 09/30/2013. Taper as follows:4-4-4-3-3-2-2          BP 127/89  Pulse 82  Temp(Src) 98.4 F (36.9 C) (Oral)  Resp 16  SpO2 96% Physical Exam  Constitutional: He is oriented to person, place, and time. He appears well-developed and well-nourished.  HENT:  Head: Normocephalic and atraumatic.  Eyes: EOM are normal. Pupils are equal, round, and reactive to light.  Neck: Neck supple.  Cardiovascular: Regular rhythm and intact distal pulses.   Pulmonary/Chest: Effort normal and breath sounds normal. No respiratory distress.  Abdominal: Soft. He exhibits no distension. There is no tenderness.  Musculoskeletal: Normal range of motion. He exhibits no edema.  Tender over mid lumbar and paralumbar spine. Pelvis stable. No lower extremity deficits with equal patellar DTRs, equal dorsi plantar flexion and sensorium to light touch grossly intact  Neurological: He is alert and oriented  to person, place, and time. No cranial nerve deficit. Coordination normal.  Skin: Skin is warm and dry.    ED Course  Procedures (including critical care time) Labs Review Labs Reviewed - No data to display Imaging Review Dg Lumbar Spine Complete  07/02/2013   CLINICAL DATA:  Lower back and groin pain.  EXAM: LUMBAR SPINE - COMPLETE 4+ VIEW  COMPARISON:  None.  FINDINGS: There is no evidence of fracture or subluxation. Vertebral bodies demonstrate normal height and alignment. Intervertebral disc spaces  are preserved. The visualized neural foramina are grossly unremarkable in appearance.  The visualized bowel gas pattern is unremarkable in appearance; air and stool are noted within the colon. The sacroiliac joints are within normal limits.  IMPRESSION: No evidence of fracture or subluxation along the lumbar spine.   Electronically Signed   By: Roanna RaiderJeffery  Chang M.D.   On: 07/02/2013 05:04    Dilaudid IM. Ice. Motrin.  6:22 AM pain improved, ambulating in the emergency department with still some back pain but feeling significantly better.  Plan discharge home with back pain precautions. Will provide prescription for Motrin 800 mg and Percocet to take as needed. Patient agrees to close primary care followup. Back pain precautions provided. MDM   Diagnosis: Low back pain  Evaluated with imaging reviewed as above. Pain improved with medications including narcotics Vital signs and nursing notes reviewed and considered.    Sunnie NielsenBrian Gage Treiber, MD 07/02/13 [phone]

## 2013-07-02 NOTE — Discharge Instructions (Signed)
Back Pain, Adult Low back pain is very common. About 1 in 5 people have back pain.The cause of low back pain is rarely dangerous. The pain often gets better over time.About half of people with a sudden onset of back pain feel better in just 2 weeks. About 8 in 10 people feel better by 6 weeks.  CAUSES Some common causes of back pain include:  Strain of the muscles or ligaments supporting the spine.  Wear and tear (degeneration) of the spinal discs.  Arthritis.  Direct injury to the back. DIAGNOSIS Most of the time, the direct cause of low back pain is not known.However, back pain can be treated effectively even when the exact cause of the pain is unknown.Answering your caregiver's questions about your overall health and symptoms is one of the most accurate ways to make sure the cause of your pain is not dangerous. If your caregiver needs more information, he or she may order lab work or imaging tests (X-rays or MRIs).However, even if imaging tests show changes in your back, this usually does not require surgery. HOME CARE INSTRUCTIONS For many people, back pain returns.Since low back pain is rarely dangerous, it is often a condition that people can learn to manageon their own.   Remain active. It is stressful on the back to sit or stand in one place. Do not sit, drive, or stand in one place for more than 30 minutes at a time. Take short walks on level surfaces as soon as pain allows.Try to increase the length of time you walk each day.  Do not stay in bed.Resting more than 1 or 2 days can delay your recovery.  Do not avoid exercise or work.Your body is made to move.It is not dangerous to be active, even though your back may hurt.Your back will likely heal faster if you return to being active before your pain is gone.  Pay attention to your body when you bend and lift. Many people have less discomfortwhen lifting if they bend their knees, keep the load close to their bodies,and  avoid twisting. Often, the most comfortable positions are those that put less stress on your recovering back.  Find a comfortable position to sleep. Use a firm mattress and lie on your side with your knees slightly bent. If you lie on your back, put a pillow under your knees.  Only take over-the-counter or prescription medicines as directed by your caregiver. Over-the-counter medicines to reduce pain and inflammation are often the most helpful.Your caregiver may prescribe muscle relaxant drugs.These medicines help dull your pain so you can more quickly return to your normal activities and healthy exercise.  Put ice on the injured area.  Put ice in a plastic bag.  Place a towel between your skin and the bag.  Leave the ice on for 15-20 minutes, 03-04 times a day for the first 2 to 3 days. After that, ice and heat may be alternated to reduce pain and spasms.  Ask your caregiver about trying back exercises and gentle massage. This may be of some benefit.  Avoid feeling anxious or stressed.Stress increases muscle tension and can worsen back pain.It is important to recognize when you are anxious or stressed and learn ways to manage it.Exercise is a great option. SEEK MEDICAL CARE IF:  You have pain that is not relieved with rest or medicine.  You have pain that does not improve in 1 week.  You have new symptoms.  You are generally not feeling well. SEEK   IMMEDIATE MEDICAL CARE IF:   You have pain that radiates from your back into your legs.  You develop new bowel or bladder control problems.  You have unusual weakness or numbness in your arms or legs.  You develop nausea or vomiting.  You develop abdominal pain.  You feel faint. Document Released: 03/14/2005 Document Revised: 09/13/2011 Document Reviewed: 08/02/2010 [employer] Patient Information 2014 [employer].  

## 2013-07-08 ENCOUNTER — Ambulatory Visit (INDEPENDENT_AMBULATORY_CARE_PROVIDER_SITE_OTHER): Payer: BC Managed Care – PPO | Admitting: Internal Medicine

## 2013-07-08 ENCOUNTER — Encounter: Payer: Self-pay | Admitting: Internal Medicine

## 2013-07-08 ENCOUNTER — Ambulatory Visit: Payer: BC Managed Care – PPO | Admitting: Internal Medicine

## 2013-07-08 VITALS — BP 130/96 | HR 81 | Temp 97.7°F | Resp 20 | Ht 71.25 in | Wt 195.0 lb

## 2013-07-08 DIAGNOSIS — M545 Low back pain, unspecified: Secondary | ICD-10-CM

## 2013-07-08 NOTE — Progress Notes (Signed)
Subjective:    Patient ID: Hayden Murphy, male    DOB: 02/01/1981, 33 y.o.   MRN: [id number]  HPI  33 year old patient who is seen today with recurrence.  Low back pain.  This has been present for approximately 3 weeks.  It has prompted a recent ED evaluation, where radiographs were negative.  Pain is in the lumbar area without radiation.  No motor weakness.  No prior history of chronic low back pain.  Past Medical History  Diagnosis Date  . HYPERLIPIDEMIA 06/13/2008  . PNEUMONIA, COMMUNITY ACQUIRED, PNEUMOCOCCAL 06/13/2008  . DRY SKIN 02/17/2010    History   Social History  . Marital Status: Married    Spouse Name: N/A    Number of Children: N/A  . Years of Education: N/A   Occupational History  . Not on file.   Social History Main Topics  . Smoking status: Never Smoker   . Smokeless tobacco: Never Used  . Alcohol Use: 1.0 oz/week    2 drink(s) per week  . Drug Use: No  . Sexual Activity: Not on file   Other Topics Concern  . Not on file   Social History Narrative  . No narrative on file    History reviewed. No pertinent past surgical history.  Family History  Problem Relation Age of Onset  . Hyperlipidemia Father   . Hyperlipidemia Brother     both brothers    No Known Allergies  Current Outpatient Prescriptions on File Prior to Visit  Medication Sig Dispense Refill  . atorvastatin (LIPITOR) 80 MG tablet TAKE ONE TABLET BY MOUTH ONE TIME DAILY  90 tablet  6  . ibuprofen (ADVIL,MOTRIN) 800 MG tablet Take 1 tablet (800 mg total) by mouth 3 (three) times daily.  21 tablet  0  . oxyCODONE-acetaminophen (PERCOCET/ROXICET) 5-325 MG per tablet Take 2 tablets by mouth every 4 (four) hours as needed for severe pain.  15 tablet  0   No current facility-administered medications on file prior to visit.    BP 130/96  Pulse 81  Temp(Src) 97.7 F (36.5 C) (Oral)  Resp 20  Ht 5' 11.25" (1.81 m)  Wt 195 lb (88.451 kg)  BMI 27.00 kg/m2  SpO2 98%       Review  of Systems  Constitutional: Negative for fever, chills, appetite change and fatigue.  HENT: Negative for congestion, dental problem, ear pain, hearing loss, sore throat, tinnitus, trouble swallowing and voice change.   Eyes: Negative for pain, discharge and visual disturbance.  Respiratory: Negative for cough, chest tightness, wheezing and stridor.   Cardiovascular: Negative for chest pain, palpitations and leg swelling.  Gastrointestinal: Negative for nausea, vomiting, abdominal pain, diarrhea, constipation, blood in stool and abdominal distention.  Genitourinary: Negative for urgency, hematuria, flank pain, discharge, difficulty urinating and genital sores.  Musculoskeletal: Positive for back pain. Negative for arthralgias, gait problem, joint swelling, myalgias and neck stiffness.  Skin: Negative for rash.  Neurological: Negative for dizziness, syncope, speech difficulty, weakness, numbness and headaches.  Hematological: Negative for adenopathy. Does not bruise/bleed easily.  Psychiatric/Behavioral: Negative for behavioral problems and dysphoric mood. The patient is not nervous/anxious.        Objective:   Physical Exam  Constitutional: He appears well-developed and well-nourished. No distress.  Blood pressure 130/90 on repeat  BP Readings from Last 3 Encounters: 07/08/13 : 130/96 07/02/13 : 127/82 06/25/13 : 128/72  Musculoskeletal:  Negative straight leg test Hip flexion on the left did tend to aggravate the discomfort Reflexes  brisk and equal Normal.  Motor exam          Assessment & Plan:   Low back pain.  Suspect musculoligamentous.  We'll continue Aleve twice a day.  Warm compresses and gentle stretching.  No indication for more specific imaging studies at this time.  Will call if not improved

## 2013-07-08 NOTE — Progress Notes (Signed)
Pre-visit discussion using our clinic review tool. No additional management support is needed unless otherwise documented below in the visit note.  

## 2013-07-08 NOTE — Patient Instructions (Signed)
Most patients with low back pain will improve with time over the next two to 6 weeks.  Keep active but avoid any activities that cause pain.  Apply moist heat to the low back area several times daily.  Call or return to clinic prn if these symptoms worsen or fail to improve as anticipated.  

## 2013-10-29 ENCOUNTER — Other Ambulatory Visit: Payer: Self-pay | Admitting: Internal Medicine

## 2014-02-15 ENCOUNTER — Other Ambulatory Visit: Payer: Self-pay | Admitting: Internal Medicine

## 2014-05-29 ENCOUNTER — Telehealth: Payer: Self-pay | Admitting: Internal Medicine

## 2014-05-29 DIAGNOSIS — Z3009 Encounter for other general counseling and advice on contraception: Secondary | ICD-10-CM

## 2014-05-29 MED ORDER — ATORVASTATIN CALCIUM 80 MG PO TABS: 80.0000 mg | ORAL_TABLET | Freq: Every day | ORAL | Status: DC

## 2014-05-29 NOTE — Telephone Encounter (Signed)
ok 

## 2014-05-29 NOTE — Telephone Encounter (Signed)
Pt request refill of the following: atorvastatin (LIPITOR) 80 MG tablet   Phamacy: [facility] [address]

## 2014-05-29 NOTE — Telephone Encounter (Signed)
Pt called to ask for a referral he want a VASECTOMY.

## 2014-05-29 NOTE — Telephone Encounter (Signed)
Spoke to pt, told him did order for referral to Urology for Vasectomy and someone will contact you regarding an appointment. Also Rx sent to pharmacy for 90 day supply only, need to schedule physical you are overdue. Pt verbalized understanding.

## 2014-05-29 NOTE — Telephone Encounter (Signed)
Dr. Kirtland BouchardK, okay to do referral to Urology for Vasectomy?

## 2014-05-29 NOTE — Addendum Note (Signed)
Addended by: Jimmye NormanPHANOS, Carlton Buskey J on: 05/29/2014 01:36 PM   Modules accepted: Orders

## 2014-08-26 ENCOUNTER — Other Ambulatory Visit: Payer: Self-pay | Admitting: Internal Medicine

## 2014-12-09 ENCOUNTER — Other Ambulatory Visit: Payer: Self-pay | Admitting: Internal Medicine

## 2015-01-13 IMAGING — CR DG LUMBAR SPINE COMPLETE 4+V
5 series · 5 of 5 positions shown · non-contrast
Comparison: None.

CLINICAL DATA: Lower back and groin pain.

EXAM:
LUMBAR SPINE - COMPLETE 4+ VIEW

[t lumbar spine ap]
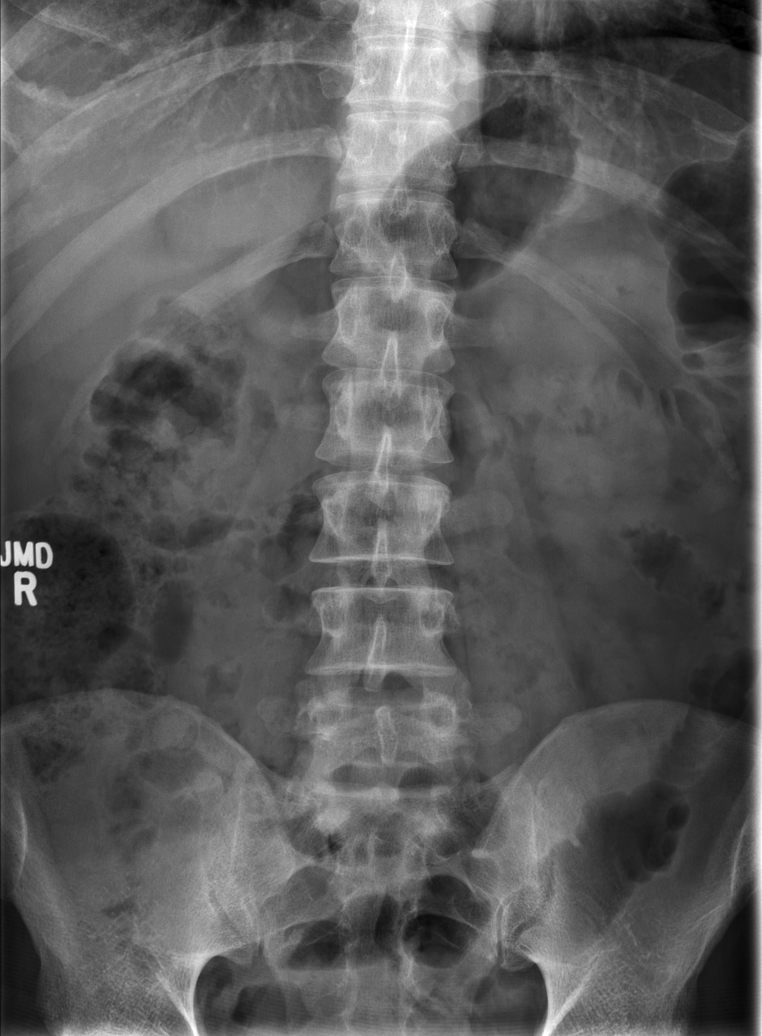

[t lumbar spine obl (1 of 2)]
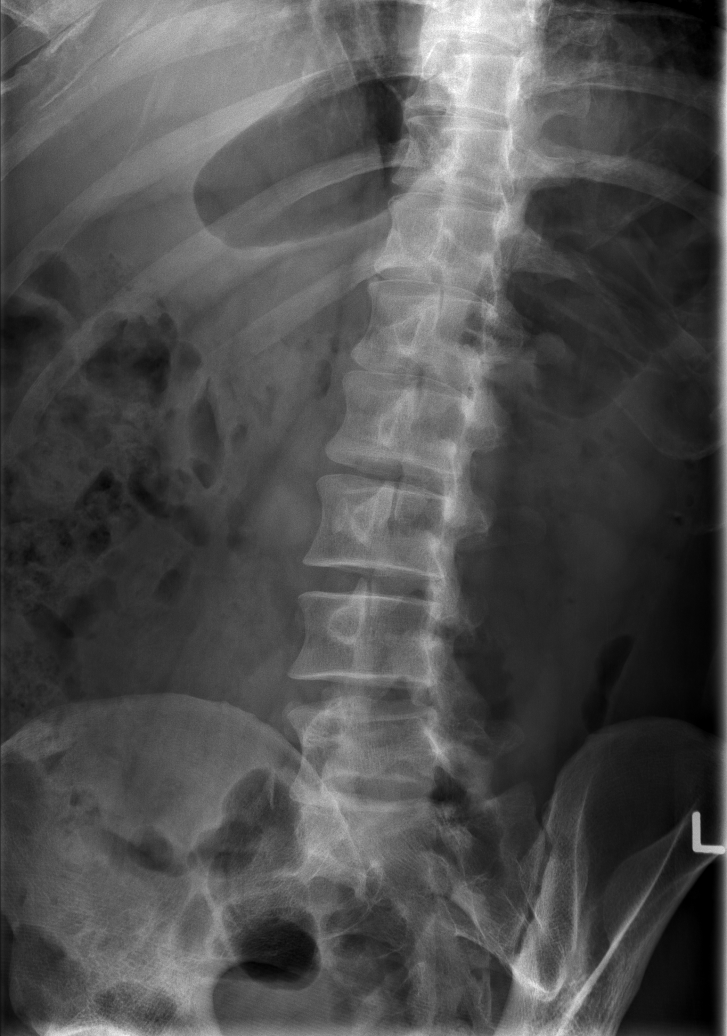

[t lumbar spine obl (2 of 2)]
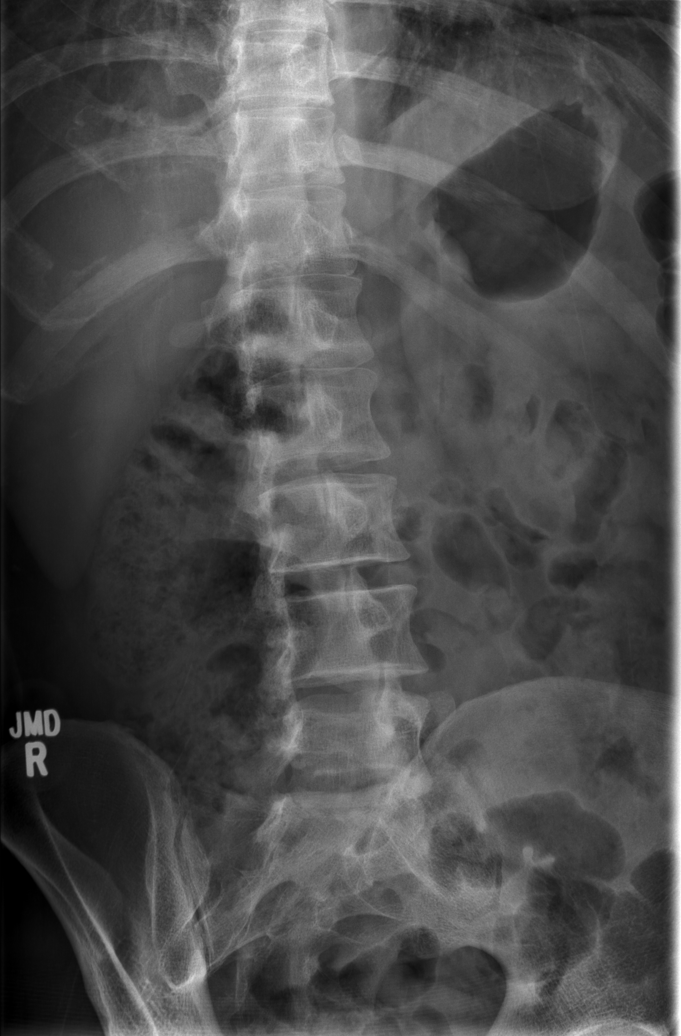

[t lumbar spine lat]
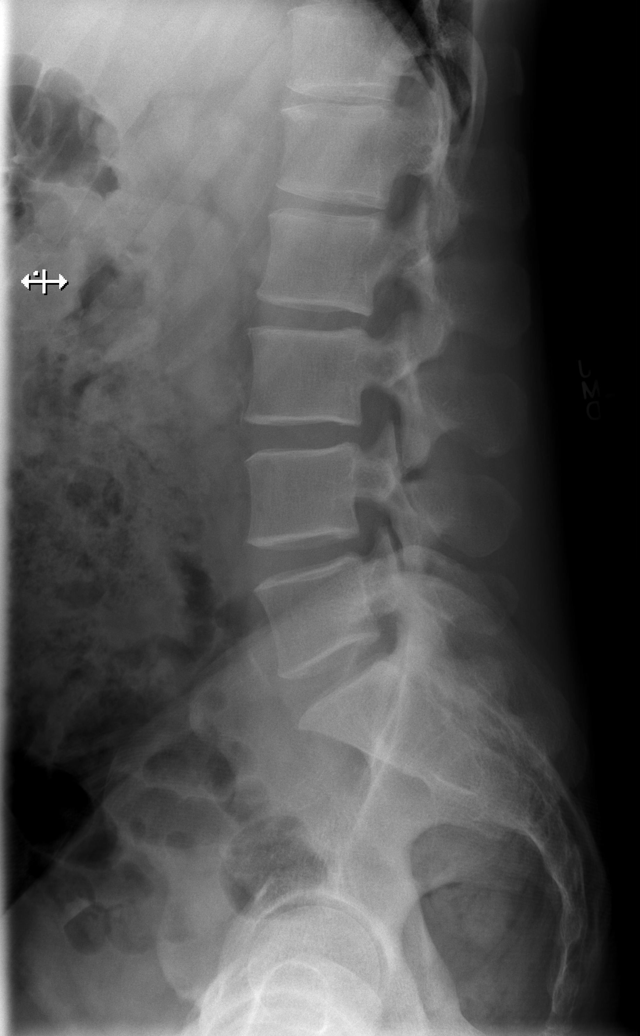

[t lumbar l-5 s-1 spot]
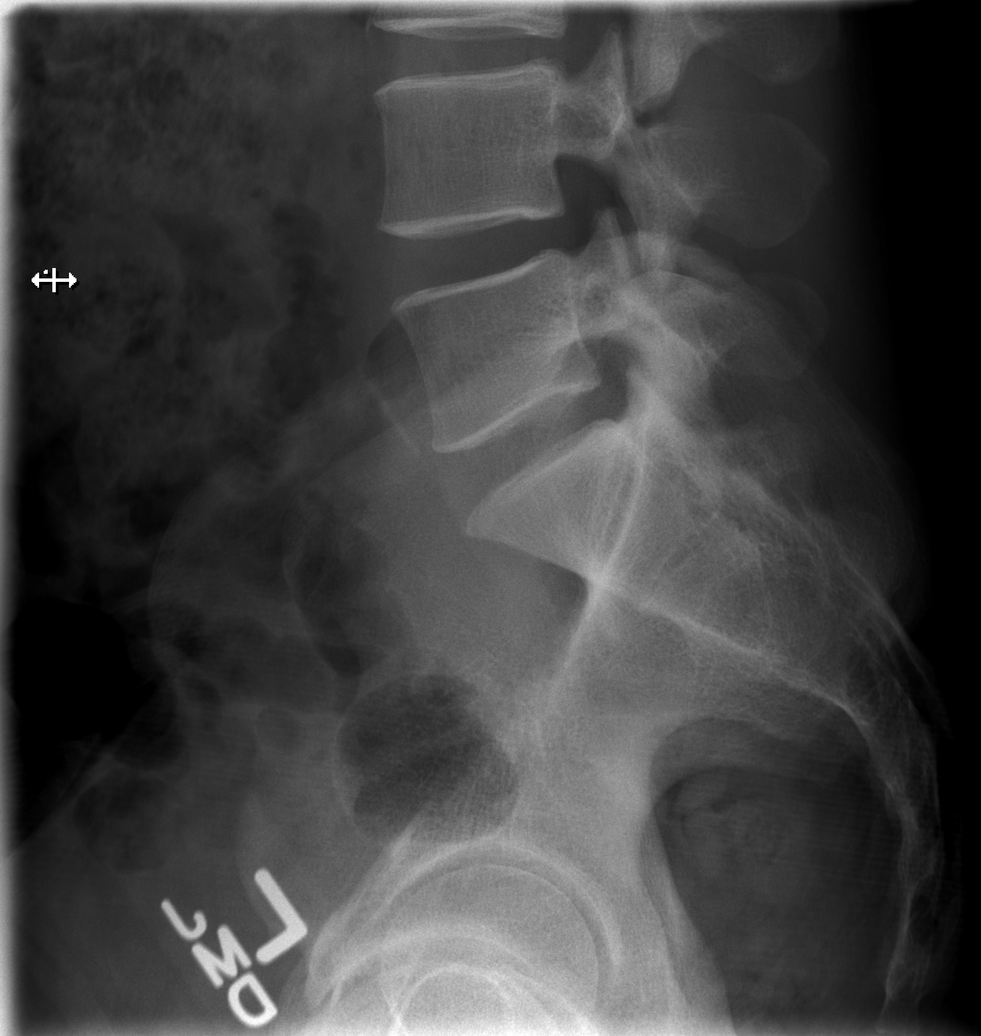

[5 of 5 positions shown; findings below may reference images not displayed]

FINDINGS: There is no evidence of fracture or subluxation. Vertebral bodies
demonstrate normal height and alignment. Intervertebral disc spaces
are preserved. The visualized neural foramina are grossly
unremarkable in appearance.

The visualized bowel gas pattern is unremarkable in appearance; air
and stool are noted within the colon. The sacroiliac joints are
within normal limits.
IMPRESSION: No evidence of fracture or subluxation along the lumbar spine.

## 2015-04-13 ENCOUNTER — Telehealth: Payer: Self-pay | Admitting: Internal Medicine

## 2015-04-13 MED ORDER — ATORVASTATIN CALCIUM 80 MG PO TABS: 80.0000 mg | ORAL_TABLET | Freq: Every day | ORAL | Status: AC

## 2015-04-13 NOTE — Telephone Encounter (Signed)
Lupita LeashDonna pt moved to [place] and does not see his new doctor till the week of 05/18/15. He is asking if a months worth can be call in for him

## 2015-04-13 NOTE — Telephone Encounter (Signed)
Please call pt and schedule physical can not refill till seen. Physical overdue last seen 06/2013. Thanks

## 2015-04-13 NOTE — Telephone Encounter (Signed)
Pt request refill of the following: atorvastatin (LIPITOR) 80 MG tablet   Phamacy: [facility] [address]

## 2015-04-13 NOTE — Telephone Encounter (Signed)
Left message on personal voicemail Rx sent to pharmacy. 

## 2015-07-22 ENCOUNTER — Other Ambulatory Visit: Payer: Self-pay | Admitting: Internal Medicine
